# Patient Record
Sex: Male | Born: 1990 | Race: Black or African American | Hispanic: No | Marital: Single | Smoking: Current every day smoker
Health system: Southern US, Community
[De-identification: ages and names within clinical notes are randomized; demographics above are authoritative.]

---

## 2011-07-07 ENCOUNTER — Encounter (HOSPITAL_COMMUNITY): Payer: Self-pay

## 2011-07-07 ENCOUNTER — Emergency Department (HOSPITAL_COMMUNITY)
Admission: EM | Admit: 2011-07-07 | Discharge: 2011-07-07 | Disposition: A | Discharge status: Home / Self Care | Payer: Self-pay | Attending: Emergency Medicine | Admitting: Emergency Medicine

## 2011-07-07 ENCOUNTER — Emergency Department (HOSPITAL_COMMUNITY): Payer: Self-pay

## 2011-07-07 DIAGNOSIS — Z720 Tobacco use: Secondary | ICD-10-CM

## 2011-07-07 DIAGNOSIS — J410 Simple chronic bronchitis: Secondary | ICD-10-CM | POA: Insufficient documentation

## 2011-07-07 DIAGNOSIS — F17200 Nicotine dependence, unspecified, uncomplicated: Secondary | ICD-10-CM | POA: Insufficient documentation

## 2011-07-07 LAB — POCT I-STAT, CHEM 8
Calcium, Ion: 1.23 mmol/L (ref 1.12–1.32)
Chloride: 104 mEq/L (ref 96–112)
HCT: 46 % (ref 39.0–52.0)
Potassium: 4.1 mEq/L (ref 3.5–5.1)
Sodium: 140 mEq/L (ref 135–145)

## 2011-07-07 MED ORDER — IOHEXOL 350 MG/ML SOLN
80.0000 mL | Freq: Once | INTRAVENOUS | Status: AC | PRN
  Administered 2011-07-07: 80 mL via INTRAVENOUS

## 2011-07-07 MED ORDER — ALBUTEROL SULFATE HFA 108 (90 BASE) MCG/ACT IN AERS
2.0000 | INHALATION_SPRAY | Freq: Four times a day (QID) | RESPIRATORY_TRACT | Status: DC
  Administered 2011-07-07: 2 via RESPIRATORY_TRACT
  Filled 2011-07-07: qty 6.7

## 2011-07-07 NOTE — ED Notes (Signed)
Pt d/c home in NAD. Pt instructed on smoking cessation. Voiced understanding of d.c instructions.

## 2011-07-07 NOTE — ED Notes (Signed)
Patient is a cigarette smoker and reports he's been coughing up blood tinged sputum intermittently x 1 month.  Patient also reports he had 1 episode of vomiting and noted a small about of blood this past Friday.

## 2011-07-07 NOTE — ED Provider Notes (Signed)
History     CSN: [id number]  Arrival date & time 07/07/11  4098   First MD Initiated Contact with Patient 07/07/11 1017      Chief Complaint  Patient presents with  . Cough    coughing up blood x 1 month    (Consider location/radiation/quality/duration/timing/severity/associated sxs/prior treatment) HPI Comments: Patient reports he has been coughing with episodes of hemoptysis for the past month.  States his sputum has turned yellow.  Is unable to quantify the amount of blood - states it is not streaks of blood and it is not entirely blood.  Has had occasional wheezing at night. Pt smokes 1-2 packs/day and has been smoking for 9 years.  Denies fevers, night sweats, unintended weight loss or weight gain, shortness of breath, chest pain.    Patient is a 21 y.o. male presenting with cough. The history is provided by the patient.  Cough Associated symptoms include wheezing. Pertinent negatives include no chest pain, no chills and no shortness of breath.    History reviewed. No pertinent past medical history.  History reviewed. No pertinent past surgical history.  History reviewed. No pertinent family history.  History  Substance Use Topics  . Smoking status: Current Everyday Smoker  . Smokeless tobacco: Not on file  . Alcohol Use: Yes      Review of Systems  Constitutional: Negative for fever, chills, activity change, appetite change and unexpected weight change.  Respiratory: Positive for cough and wheezing. Negative for shortness of breath.   Cardiovascular: Negative for chest pain.  Gastrointestinal: Negative for nausea, vomiting, abdominal pain and diarrhea.  All other systems reviewed and are negative.    Allergies  Review of patient's allergies indicates no known allergies.  Home Medications  No current outpatient prescriptions on file.  BP 133/85  Pulse 90  Temp(Src) 98.2 F (36.8 C) (Oral)  Resp 18  SpO2 100%  Physical Exam  Nursing note and vitals  reviewed. Constitutional: He is oriented to person, place, and time. He appears well-developed and well-nourished. No distress.  HENT:  Head: Normocephalic and atraumatic.  Neck: Neck supple.  Cardiovascular: Normal rate, regular rhythm and normal heart sounds.   Pulmonary/Chest: Effort normal and breath sounds normal. No respiratory distress. He has no wheezes. He has no rales. He exhibits no tenderness.  Neurological: He is alert and oriented to person, place, and time.  Skin: He is not diaphoretic.  Psychiatric: He has a normal mood and affect. His behavior is normal. Judgment and thought content normal.    ED Course  Procedures (including critical care time)  Labs Reviewed - No data to display No results found.  Discussed patient with Dr Clarene Duke.  2:31 PM Discussed results with patient.  Strongly advised patient to stop smoking, discussed dangers of smoking and some methods to help him quit.    1. Bronchitis due to tobacco use      MDM  Patient is a heavy smoker with 1 month history of cough with some amount of hemoptysis.  CT angio is negative.  Likely airway irritation from smoking.  I discussed smoking cessation with patient and advised that he quit.  Patient verbalizes understanding and agrees with plan.        Rise Patience, [place] 07/07/11 1528

## 2011-07-07 NOTE — ED Notes (Signed)
Report given to Raynelle Fanning, RN in CDU

## 2011-07-07 NOTE — ED Notes (Signed)
Pt given instructions on how to use inhaler. Pt demonstrated use properly

## 2011-07-07 NOTE — Discharge Instructions (Signed)
Please read the information below.  Use the resources below to find a primary care provider to schedule a follow up appointment.  You may return to the ER at any time for worsening condition or any new symptoms that concern you.   Bronchitis Bronchitis is the body's way of reacting to injury and/or infection (inflammation) of the bronchi. Bronchi are the air tubes that extend from the windpipe into the lungs. If the inflammation becomes severe, it may cause shortness of breath. CAUSES  Inflammation may be caused by:  A virus.   Germs (bacteria).   Dust.   Allergens.   Pollutants and many other irritants.  The cells lining the bronchial tree are covered with tiny hairs (cilia). These constantly beat upward, away from the lungs, toward the mouth. This keeps the lungs free of pollutants. When these cells become too irritated and are unable to do their job, mucus begins to develop. This causes the characteristic cough of bronchitis. The cough clears the lungs when the cilia are unable to do their job. Without either of these protective mechanisms, the mucus would settle in the lungs. Then you would develop pneumonia. Smoking is a common cause of bronchitis and can contribute to pneumonia. Stopping this habit is the single most important thing you can do to help yourself. TREATMENT   Your caregiver may prescribe an antibiotic if the cough is caused by bacteria. Also, medicines that open up your airways make it easier to breathe. Your caregiver may also recommend or prescribe an expectorant. It will loosen the mucus to be coughed up. Only take over-the-counter or prescription medicines for pain, discomfort, or fever as directed by your caregiver.   Removing whatever causes the problem (smoking, for example) is critical to preventing the problem from getting worse.   Cough suppressants may be prescribed for relief of cough symptoms.   Inhaled medicines may be prescribed to help with symptoms now  and to help prevent problems from returning.   For those with recurrent (chronic) bronchitis, there may be a need for steroid medicines.  SEEK IMMEDIATE MEDICAL CARE IF:   During treatment, you develop more pus-like mucus (purulent sputum).   You have a fever.   Your baby is older than 3 months with a rectal temperature of 102 F (38.9 C) or higher.   Your baby is 45 months old or younger with a rectal temperature of 100.4 F (38 C) or higher.   You become progressively more ill.   You have increased difficulty breathing, wheezing, or shortness of breath.  It is necessary to seek immediate medical care if you are elderly or sick from any other disease. MAKE SURE YOU:   Understand these instructions.   Will watch your condition.   Will get help right away if you are not doing well or get worse.  Document Released: 04/06/2005 Document Revised: 03/26/2011 Document Reviewed: 02/14/2008 [facility] Patient Information 2012 [place], [place]  .Metered Dose Inhaler (No Spacer Used) Inhaled medicines are the basis of asthma treatment and other breathing problems. Inhaled medicine can only be effective if used properly. Good technique assures that the medicine reaches the lungs. Metered dose inhalers (MDIs) are used to deliver a variety of inhaled medicines. These include quick relief medicines, controller medicines (such as corticosteroids), and cromolyn. The medicine is delivered by pushing down on a metal canister to release a set amount of spray.  If you are using different kinds of inhalers, use your quick relief medicine to open the airways  10 to 15 minutes before using a steroid. If you are unsure which inhalers to use and the order of using them, ask your caregiver, nurse, or respiratory therapist. HOW TO USE THE INHALER 1. Remove cap from inhaler.  2. Shake inhaler for 5 seconds before each inhalation (breathing in).  3. Position the inhaler so that the top of the canister faces up.    4. Put your index finger on the top of the medication canister. Your thumb supports the bottom of the inhaler.  5. Open your mouth.  6. Hold the inhaler 1 to 2 inches away from your open mouth. This allows the medicine to slow down before the medicine enters the mouth.  7. Exhale (breathe out) normally and as completely as possible.  8. Press the canister down with the index finger to release the medication.  9. At the same time as the canister is pressed, inhale deeply and slowly until the lungs are completely filled. This should take 4 to 6 seconds. Keep your tongue down.  10. Hold the medication in your lungs for up to 10 seconds (10 seconds is best). This helps the medicine get into the small airways of your lungs to work better.  11. Breathe out slowly, through pursed lips. Whistling is an example of pursed lips.  12. Wait at least 1 minute between puffs. Continue with the above steps until you have taken the number of puffs your caregiver has ordered.  13. Replace cap on inhaler.  AVOID:  Inhaling before or after starting the spray of medicine. It takes practice to coordinate your breathing with triggering the spray.   Inhaling through the nose (rather than the mouth) when triggering the spray.  HOW TO DETERMINE IF YOUR INHALER IS FULL OR NEARLY EMPTY:  Determine when an inhaler is empty. You cannot know when an MDI canister is empty by shaking it. A few MDIs are now being made with dose counters. Ask your caregiver for a prescription that has a dose counter if you feel you need that extra help.   If your inhaler does not have a counter, check the number of doses in the inhaler before you use it. The canister or box will list the number of doses in the canister. Divide the total number of doses in the canister by the number you will use each day to find how many days the canister will last. (For example, if your canister has 200 doses and you take 2 puffs, 4 times each day, which is 8  puffs a day. Dividing 200 by 8 equals 25. The canister should last 25 days.) Using a calendar, count forward that many days to see when your inhaler will run out. Write the refill date on a calendar or your canister.   Remember, if you need to take extra doses, the inhaler will empty sooner than you figured. Be sure you have a refill before your canister runs out. Refill your inhaler 7 to 10 days before it runs out.  HOME CARE INSTRUCTIONS   Do not use the inhaler more than your caregiver tells you. If you are still wheezing and are feeling tightness in your chest, call your caregiver.   Keep an adequate supply of medication. This includes making sure the medicine is not expired, and you have a spare MDI.   Follow your caregiver or inhaler insert directions for cleaning the inhaler.  SEEK MEDICAL CARE IF:   Symptoms are only partially relieved with your inhalers.  You are having trouble using your inhalers.   You experience some increase in phlegm.   You develop a fever of 102 F (38.9 C).  SEEK IMMEDIATE MEDICAL CARE IF:   You feel little or no relief with your inhalers. You are still wheezing and are feeling shortness of breath and/or tightness in your chest.   You have side effects such as dizziness, headaches, or fast heart rate.   You have chills, fever, night sweats or an oral temperature above 102 F (38.9 C) develops.   Phlegm production increases a lot, or there is blood in the phlegm.  MAKE SURE YOU:   Understand these instructions.   Will watch your condition.   Will get help right away if you are not doing well or get worse.  Document Released: 02/01/2007 Document Revised: 03/26/2011 Document Reviewed: 01/22/2009 [facility] Patient Information 2012 [place], [place].   If you have no primary doctor, here are some resources that may be helpful:  Medicaid-accepting [facility] Providers:   - [facility]- [address]      [phone]      Mon-Fri 9am-7pm, Sat 9am-1pm   - [facility]- [address] [place]      [phone]   - [facility]- [address] [place]      [phone]   [facility]- [address]      [phone]   - Renaye Rakers- [address]      [phone]      Only accepts [place] patients       after they have her name applied to their card   Self Pay (no insurance) in [place]:   - Sickle Cell Patients: Dr Willey Blade, [facility]      [address] [place]      [phone]   - Health [phone]   - Physician Referral Service- [phone]   - [facility]- [address] [place]      [phone]   [facility] [place]- [address]   - [facility]- see information above      (Speak to [employer] if you do not have insurance)   - [facility]- [address] [place]      [phone]   - [facility] [place]- [address]      [phone]   - [facility]- [address]      [phone]   - Dr Julio Sicks-  [address], [place]      [phone]   - [facility]- [address]      [phone]   - [facility]- [address]      [phone]      Also [address]      [phone]   - [facility]- [address]      [phone]      1st and 3rd Saturday every month, 10am-1pm Other agencies that provide inexpensive medical care:    [facility]  [phone]    [facility]  [phone]    [facility]  [phone]    [facility]  [phone]    [facility]  [phone]  General Information: Finding a doctor  when you do not have health insurance can be tricky. Although you are not limited by an insurance plan, you are of course limited by her finances and how much but he can pay out of pocket.  What  are your options if you don't have health insurance?   1) Find a Librarian, academic and Pay Out of Pocket Although you won't have to find out who is covered by your insurance plan, it is a good idea to ask around and get recommendations. You will then need to call the office and see if the doctor you have chosen will accept you as a new patient and what types of options they offer for patients who are self-pay. Some doctors offer discounts or will set up payment plans for their patients who do not have insurance, but you will need to ask so you aren't surprised when you get to your appointment.  2) Contact Your Local Health Department Not all health departments have doctors that can see patients for sick visits, but many do, so it is worth a call to see if yours does. If you don't know where your local health department is, you can check in your phone book. The CDC also has a tool to help you locate your state's health department, and many state websites also have listings of all of their local health departments.  3) Find a Walk-in Clinic If your illness is not likely to be very severe or complicated, you may want to try a walk in clinic. These are popping up all over the country in pharmacies, drugstores, and shopping centers. They're usually staffed by nurse practitioners or physician assistants that have been trained to treat common illnesses and complaints. They're usually fairly quick and inexpensive. However, if you have serious medical issues or chronic medical problems, these are probably not your best option   RESOURCE GUIDE  Dental Problems  Patients with Medicaid: [facility] [phone] [address]                                           [id number] [address] [employer] Phone:  [phone]                                                  Phone:  [phone]  If unable to pay or uninsured, contact:  [facility] or [facility] to become qualified  for the adult dental clinic.  Chronic Pain Problems Contact [facility]  [phone] Patients need to be referred by their primary care doctor.  Insufficient Money for Medicine Contact [employer] Way:  call "211" or [facility] [phone].  No Primary Care Doctor Call [facility]  [phone] Other agencies that provide inexpensive medical care    [facility]  [phone]    [facility]  [phone]    [facility]  [phone]    [facility]  [phone]    [facility]  [phone]    [facility]  [phone]  [facility]ological [facility]  [phone] [facility]  [phone] [facility]  Mental Health   [phone] (emergency services [phone])  Substance Abuse Resources [facility]  [phone] [facility] [phone] [facility] [place] [phone] [facility] [phone] Residential & Outpatient Substance Abuse Program  6045141128  Abuse/Neglect [facility] Child Abuse Hotline 8135500170 [facility] Child Abuse Hotline 334-225-2297 (After Hours)  Emergency Shelter [facility] [phone]  Maternity Homes [facility] [place] [facility] [place] [phone] [facility] [phone]  MRSA Hotline #:   (970) 659-2388    [facility] Resources  [facility] [place]     [employer]                          [facility] [address] [place]                       [address]      [address]  Blondell Reveal Phone:  [phone]                                   Phone:  [phone]                 Phone:  [phone]  [facility] Phone:  [phone]  [facility] Child Abuse Hotline (862)739-7569 424-381-6405 (After Hours)  Metered Dose Inhaler (No  Spacer Used) Inhaled medicines are the basis of asthma treatment and other breathing problems. Inhaled medicine can only be effective if used properly. Good technique assures that the medicine reaches the lungs. Metered dose inhalers (MDIs) are used to deliver a variety of inhaled medicines. These include quick relief medicines, controller medicines (such as corticosteroids), and cromolyn. The medicine is delivered by pushing down on a metal canister to release a set amount of spray.  If you are using different kinds of inhalers, use your quick relief medicine to open the airways 10 to 15 minutes before using a steroid. If you are unsure which inhalers to use and the order of using them, ask your caregiver, nurse, or respiratory therapist. HOW TO USE THE INHALER 14. Remove cap from inhaler.  15. Shake inhaler for 5 seconds before each inhalation (breathing in).  16. Position the inhaler so that the top of the canister faces up.  17. Put your index finger on the top of the medication canister. Your thumb supports the bottom of the inhaler.  18. Open your mouth.  19. Hold the inhaler 1 to 2 inches away from your open mouth. This allows the medicine to slow down before the medicine enters the mouth.  20. Exhale (breathe out) normally and as completely as possible.  21. Press the canister down with the index finger to release the medication.  22. At the same time as the canister is pressed, inhale deeply and slowly until the lungs are completely filled. This should take 4 to 6  seconds. Keep your tongue down.  23. Hold the medication in your lungs for up to 10 seconds (10 seconds is best). This helps the medicine get into the small airways of your lungs to work better.  24. Breathe out slowly, through pursed lips. Whistling is an example of pursed lips.  25. Wait at least 1 minute between puffs. Continue with the above steps until you have taken the number of puffs your caregiver has ordered.   26. Replace cap on inhaler.  AVOID:  Inhaling before or after starting the spray of medicine. It takes practice to coordinate your breathing with triggering the spray.   Inhaling through the nose (rather than the mouth) when triggering the spray.  HOW TO DETERMINE IF YOUR INHALER IS FULL OR NEARLY EMPTY:  Determine when an inhaler is empty. You cannot know when an MDI canister is empty by shaking it. A few MDIs are now being made with dose counters. Ask your caregiver for a prescription that has a dose counter if you feel you need that extra help.   If your inhaler does not have a counter, check the number of doses in the inhaler before you use it. The canister or box will list the number of doses in the canister. Divide the total number of doses in the canister by the number you will use each day to find how many days the canister will last. (For example, if your canister has 200 doses and you take 2 puffs, 4 times each day, which is 8 puffs a day. Dividing 200 by 8 equals 25. The canister should last 25 days.) Using a calendar, count forward that many days to see when your inhaler will run out. Write the refill date on a calendar or your canister.   Remember, if you need to take extra doses, the inhaler will empty sooner than you figured. Be sure you have a refill before your canister runs out. Refill your inhaler 7 to 10 days before it runs out.  HOME CARE INSTRUCTIONS   Do not use the inhaler more than your caregiver tells you. If you are still wheezing and are feeling tightness in your chest, call your caregiver.   Keep an adequate supply of medication. This includes making sure the medicine is not expired, and you have a spare MDI.   Follow your caregiver or inhaler insert directions for cleaning the inhaler.  SEEK MEDICAL CARE IF:   Symptoms are only partially relieved with your inhalers.   You are having trouble using your inhalers.   You experience some increase in phlegm.    You develop a fever of 102 F (38.9 C).  SEEK IMMEDIATE MEDICAL CARE IF:   You feel little or no relief with your inhalers. You are still wheezing and are feeling shortness of breath and/or tightness in your chest.   You have side effects such as dizziness, headaches, or fast heart rate.   You have chills, fever, night sweats or an oral temperature above 102 F (38.9 C) develops.   Phlegm production increases a lot, or there is blood in the phlegm.  MAKE SURE YOU:   Understand these instructions.   Will watch your condition.   Will get help right away if you are not doing well or get worse.  Document Released: 02/01/2007 Document Revised: 03/26/2011 Document Reviewed: 01/22/2009 [facility] Patient Information 2012 [place], [place].

## 2011-07-07 NOTE — ED Notes (Signed)
Patient reporting intermittently coughing up blood tinged sputum x 1 month.  Patient is a current cigarette smoker; no cough present, patient resting quietly, reporting 3/10 throat pain as well.  Throat does not appear inflamed.

## 2011-07-09 NOTE — ED Provider Notes (Signed)
Medical screening examination/treatment/procedure(s) were performed by non-physician practitioner and as supervising physician I was immediately available for consultation/collaboration.   Nabor Thomann M Vear Staton, DO 07/09/11 1258 

## 2012-10-10 ENCOUNTER — Emergency Department (HOSPITAL_COMMUNITY)
Admission: EM | Admit: 2012-10-10 | Discharge: 2012-10-10 | Disposition: A | Discharge status: Home / Self Care | Payer: Self-pay | Source: Home / Self Care

## 2013-03-17 IMAGING — CT CT ANGIO CHEST
2 of 6 series · 19 of 46 positions shown · IV contrast (APPLIED)
Comparison: Chest radiograph of 07/07/2011.

CLINICAL DATA: Intermittent blood tinged sputum.  Tobacco use.
Throat pain.

CT ANGIOGRAPHY CHEST
TECHNIQUE: Multidetector CT imaging of the chest using the
standard protocol during bolus administration of intravenous
contrast. Multiplanar reconstructed images including MIPs were
obtained and reviewed to evaluate the vascular anatomy.
Contrast: 80mL OMNIPAQUE IOHEXOL 350 MG/ML IV SOLN

[Series 6: pulm embolism 1.0 b25f thin · axial · 0.65mm/px · z∈[-258,+20]mm · 16 of 306 slices shown]
[im 14/306  lung]
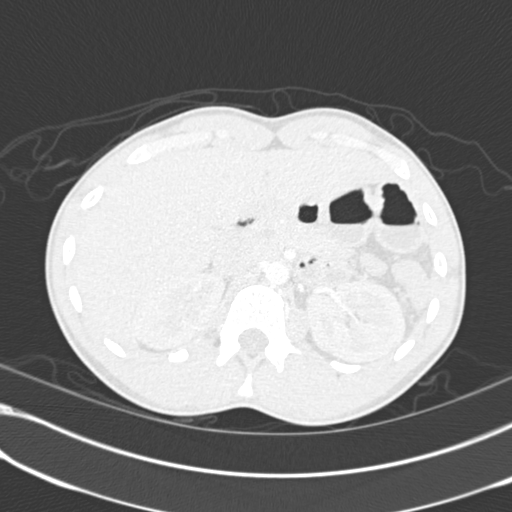
[im 40/306  soft-tissue]
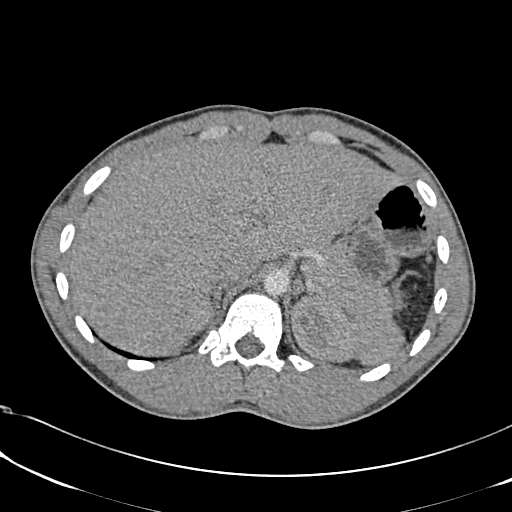
[im 54/306  lung]
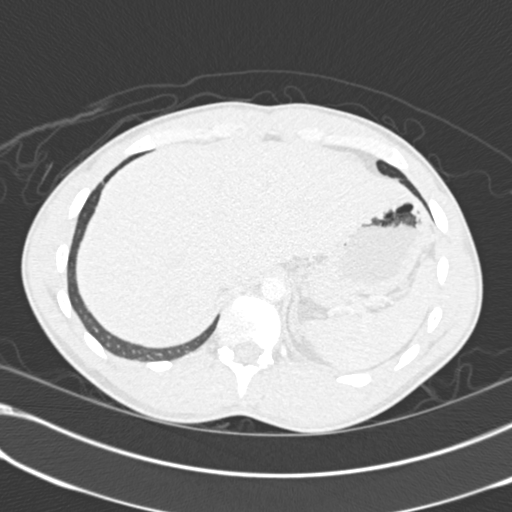
[im 67/306  soft-tissue]
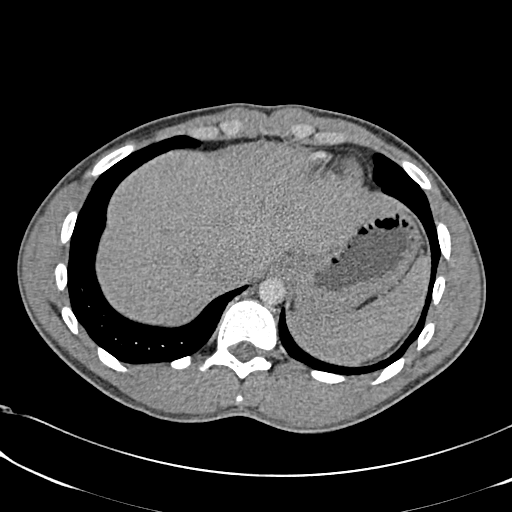
[im 93/306  lung]
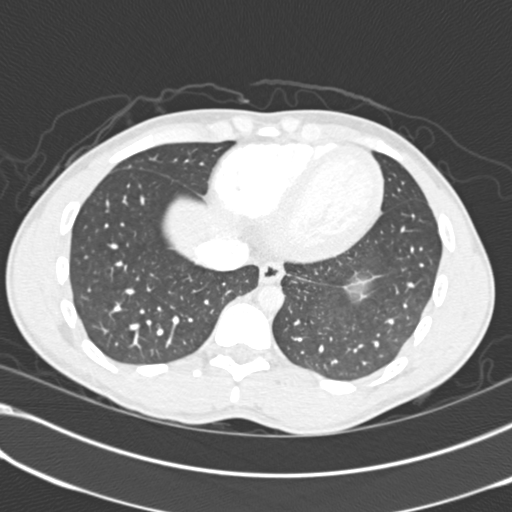
[im 107/306  soft-tissue]
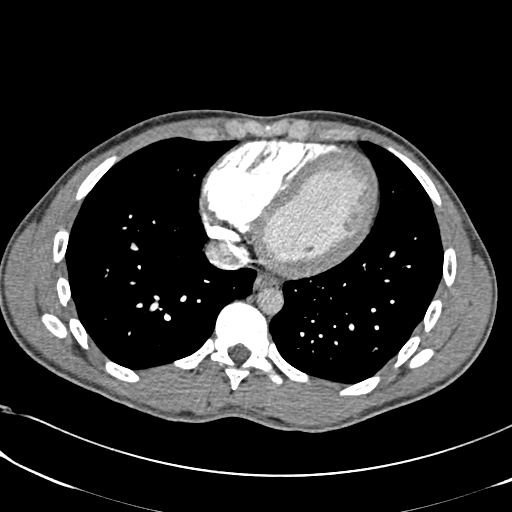
[im 120/306  lung]
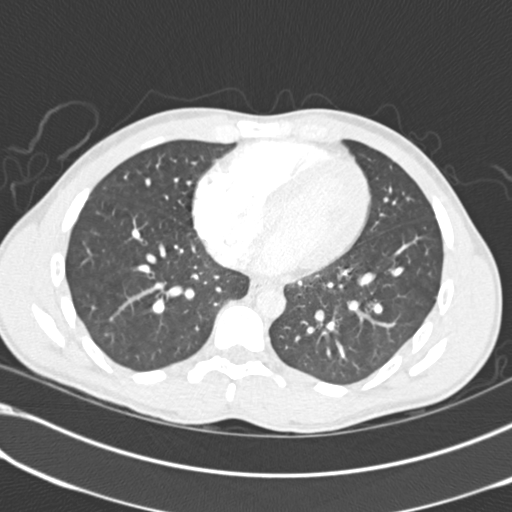
[im 146/306  soft-tissue]
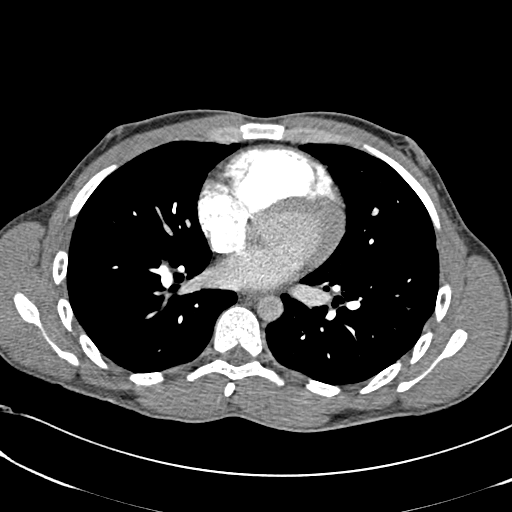
[im 160/306  lung]
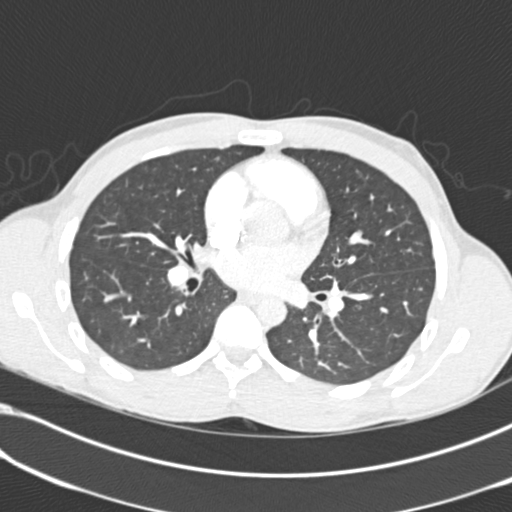
[im 186/306  soft-tissue]
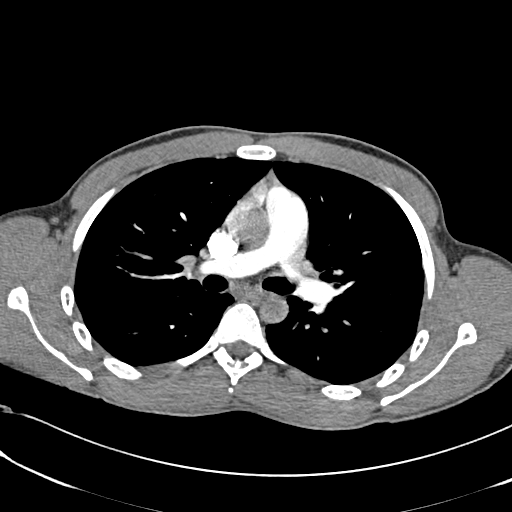
[im 199/306  lung]
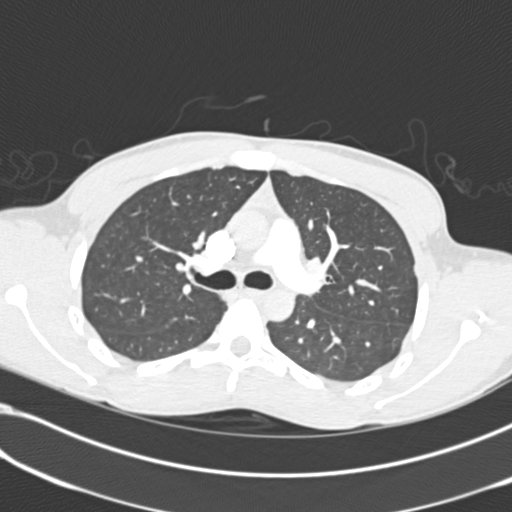
[im 213/306  soft-tissue]
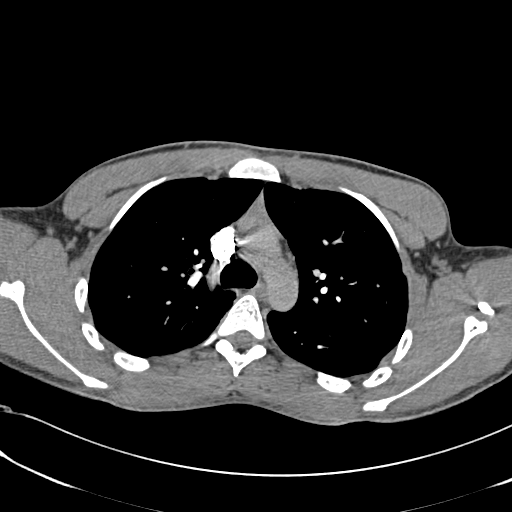
[im 239/306  lung]
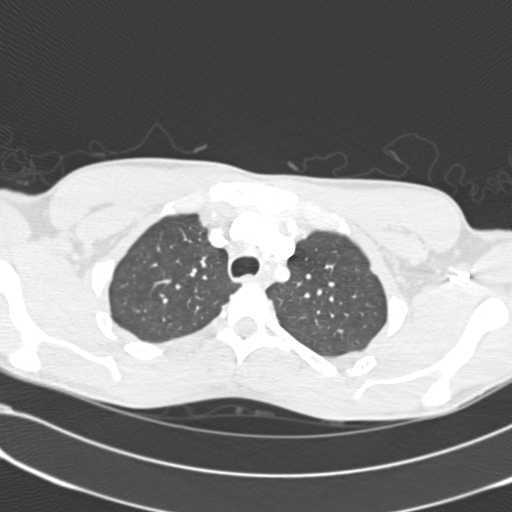
[im 252/306  soft-tissue]
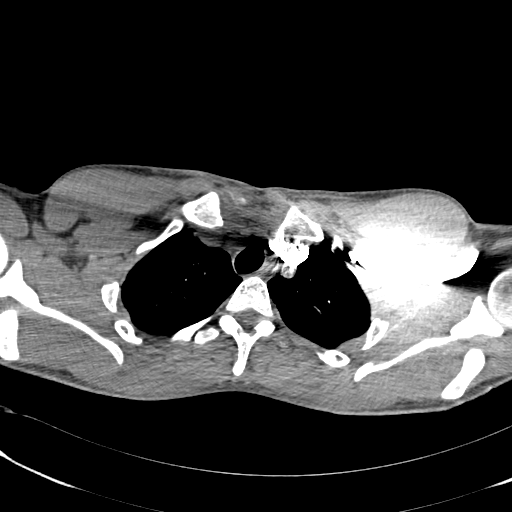
[im 266/306  lung]
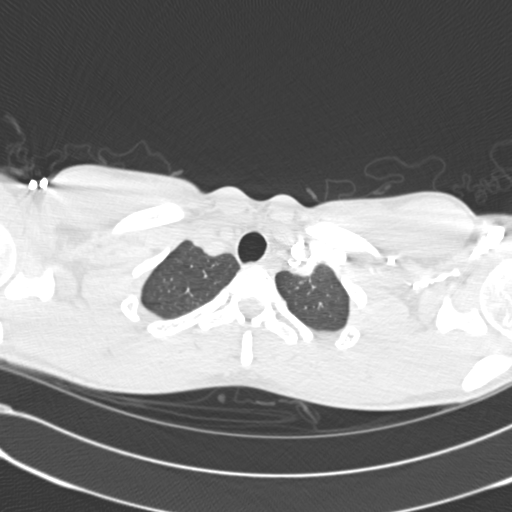
[im 292/306  soft-tissue]
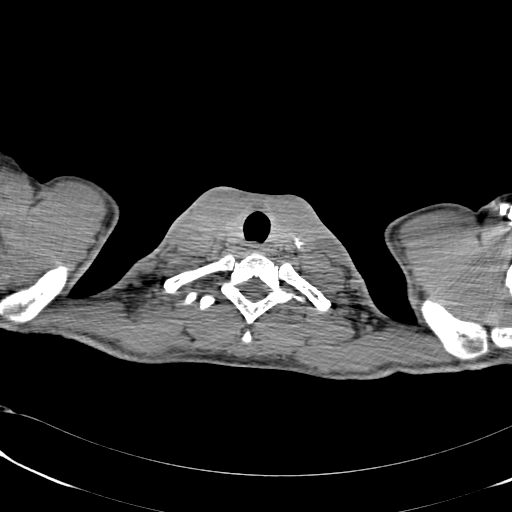

[Series 602: cor · coronal · 0.65mm/px · 3 of 86 slices shown]
[im 22/86  soft-tissue]
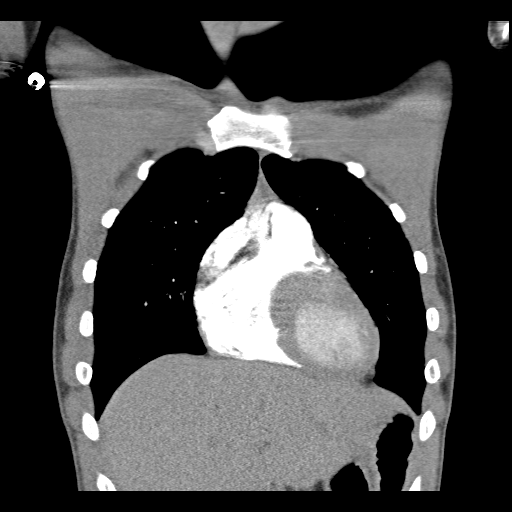
[im 43/86  soft-tissue]
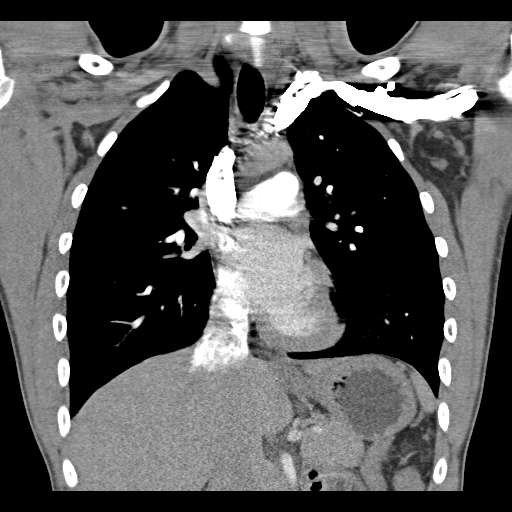
[im 64/86  soft-tissue]
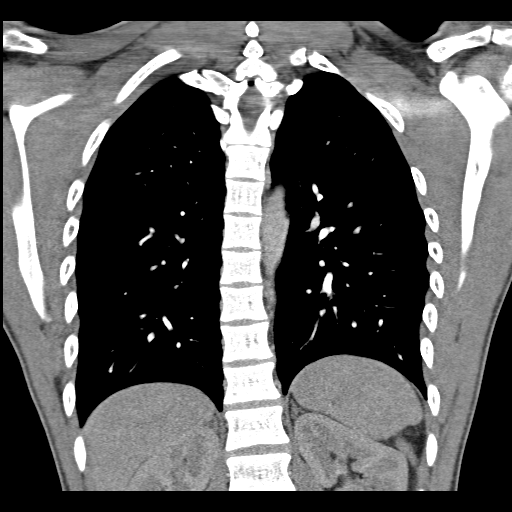

[19 of 46 positions shown; findings below may reference images not displayed]

FINDINGS: No filling defect is identified in the pulmonary arterial
tree to suggest pulmonary embolus.

We do not have adequate contrast in the thoracic aorta to assess
for dissection, although no obvious signs of dissection or
observed.  Mild contrast reflux into the IVC is likely incidental,
and there is no reversible of the contour of the interventricular
septum.

Minimal residual anterior mediastinal thymic tissue noted.
Borderline right hilar adenopathy is present.

Posterior filling defect in the trachea is compatible with mucus.
There is borderline airway thickening.  The lungs appear otherwise
clear.
IMPRESSION: 1.  No embolus identified.
2.  Borderline airway thickening, conceivably representing minimal
bronchitis or reactive airways disease.
3.  Dependent mucous in the trachea.

## 2013-03-17 IMAGING — CR DG CHEST 2V
2 series · 2 of 2 positions shown · non-contrast
Comparison: None.

CLINICAL DATA: Cough.  Hemoptysis.  Tobacco use.

CHEST - 2 VIEW

[w chest pa]
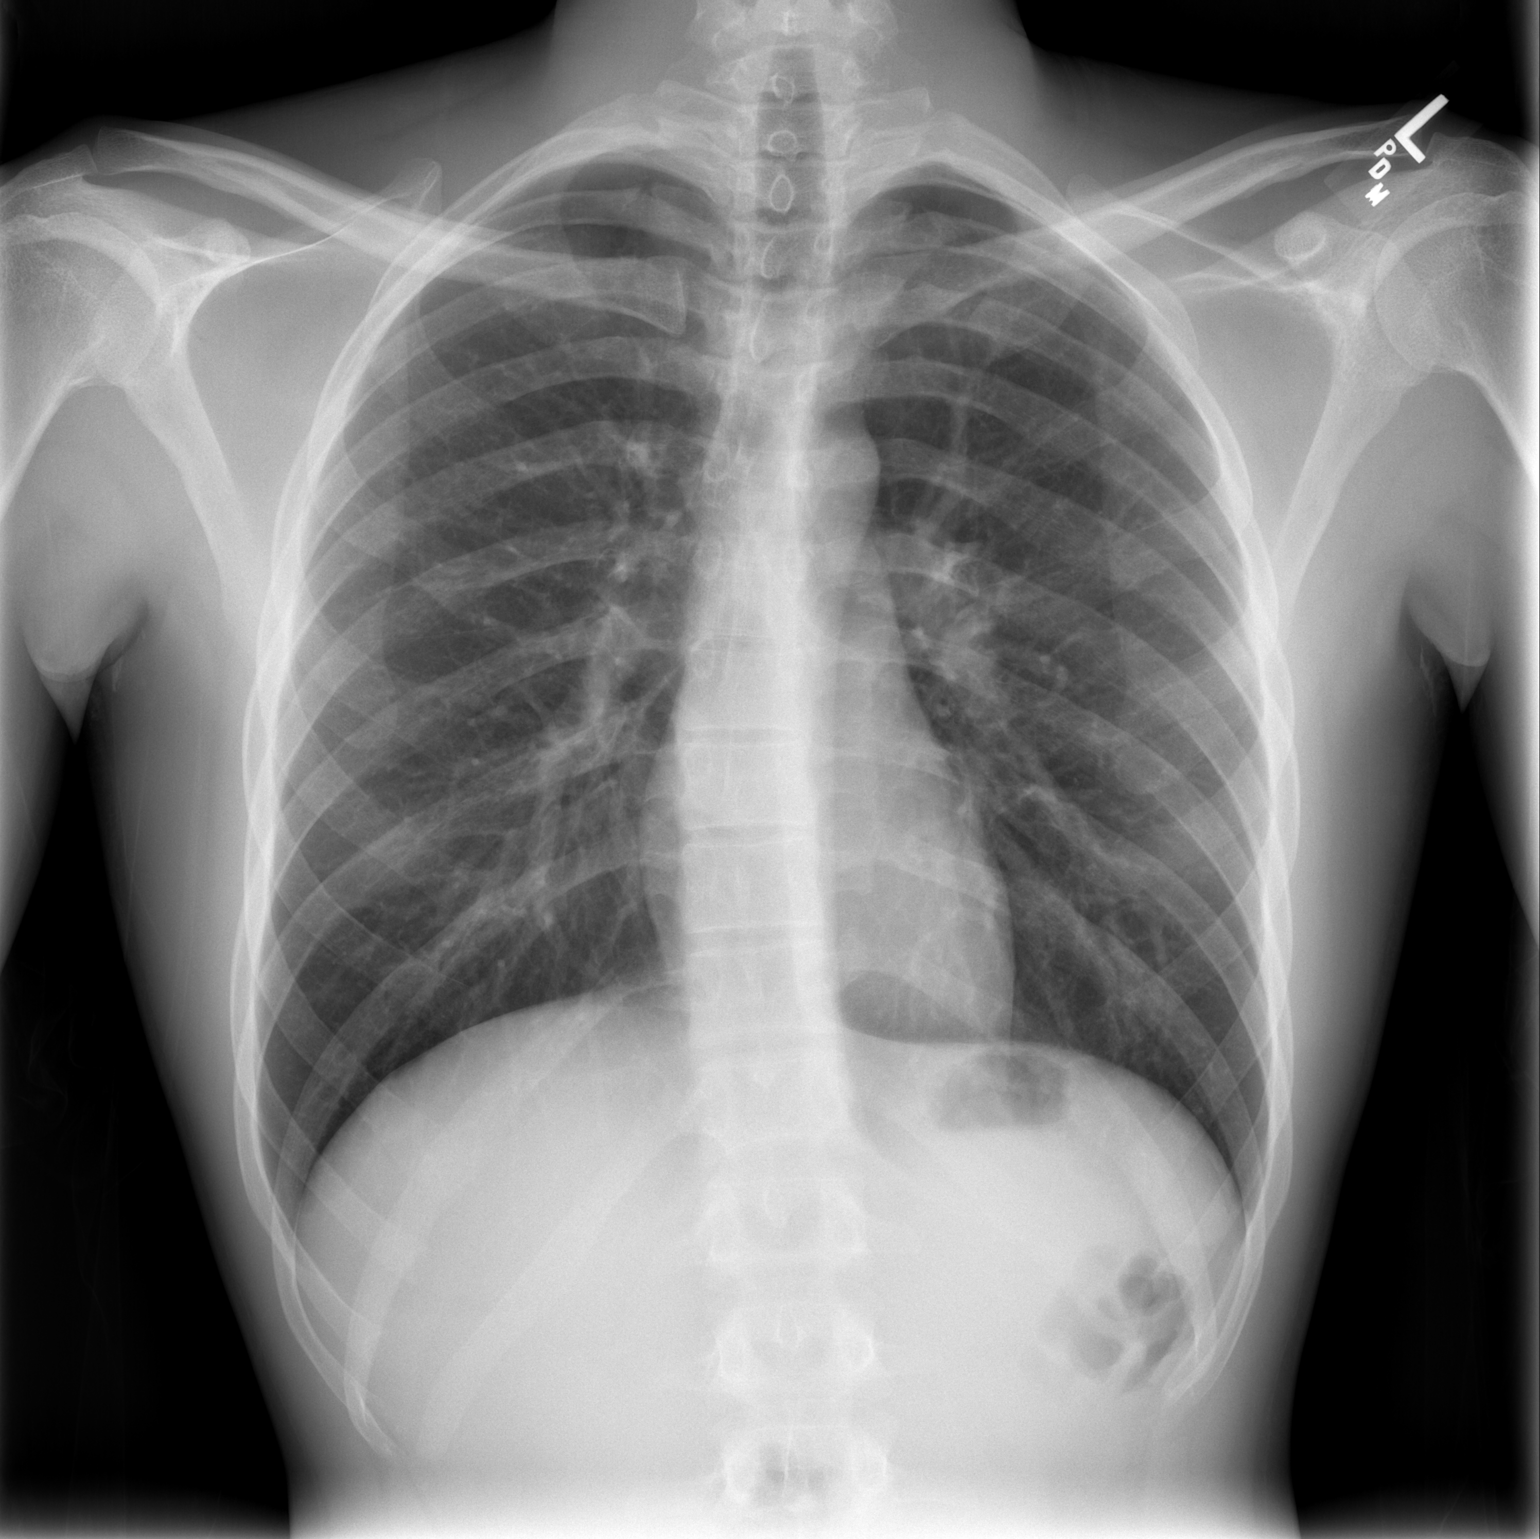

[w chest lat]
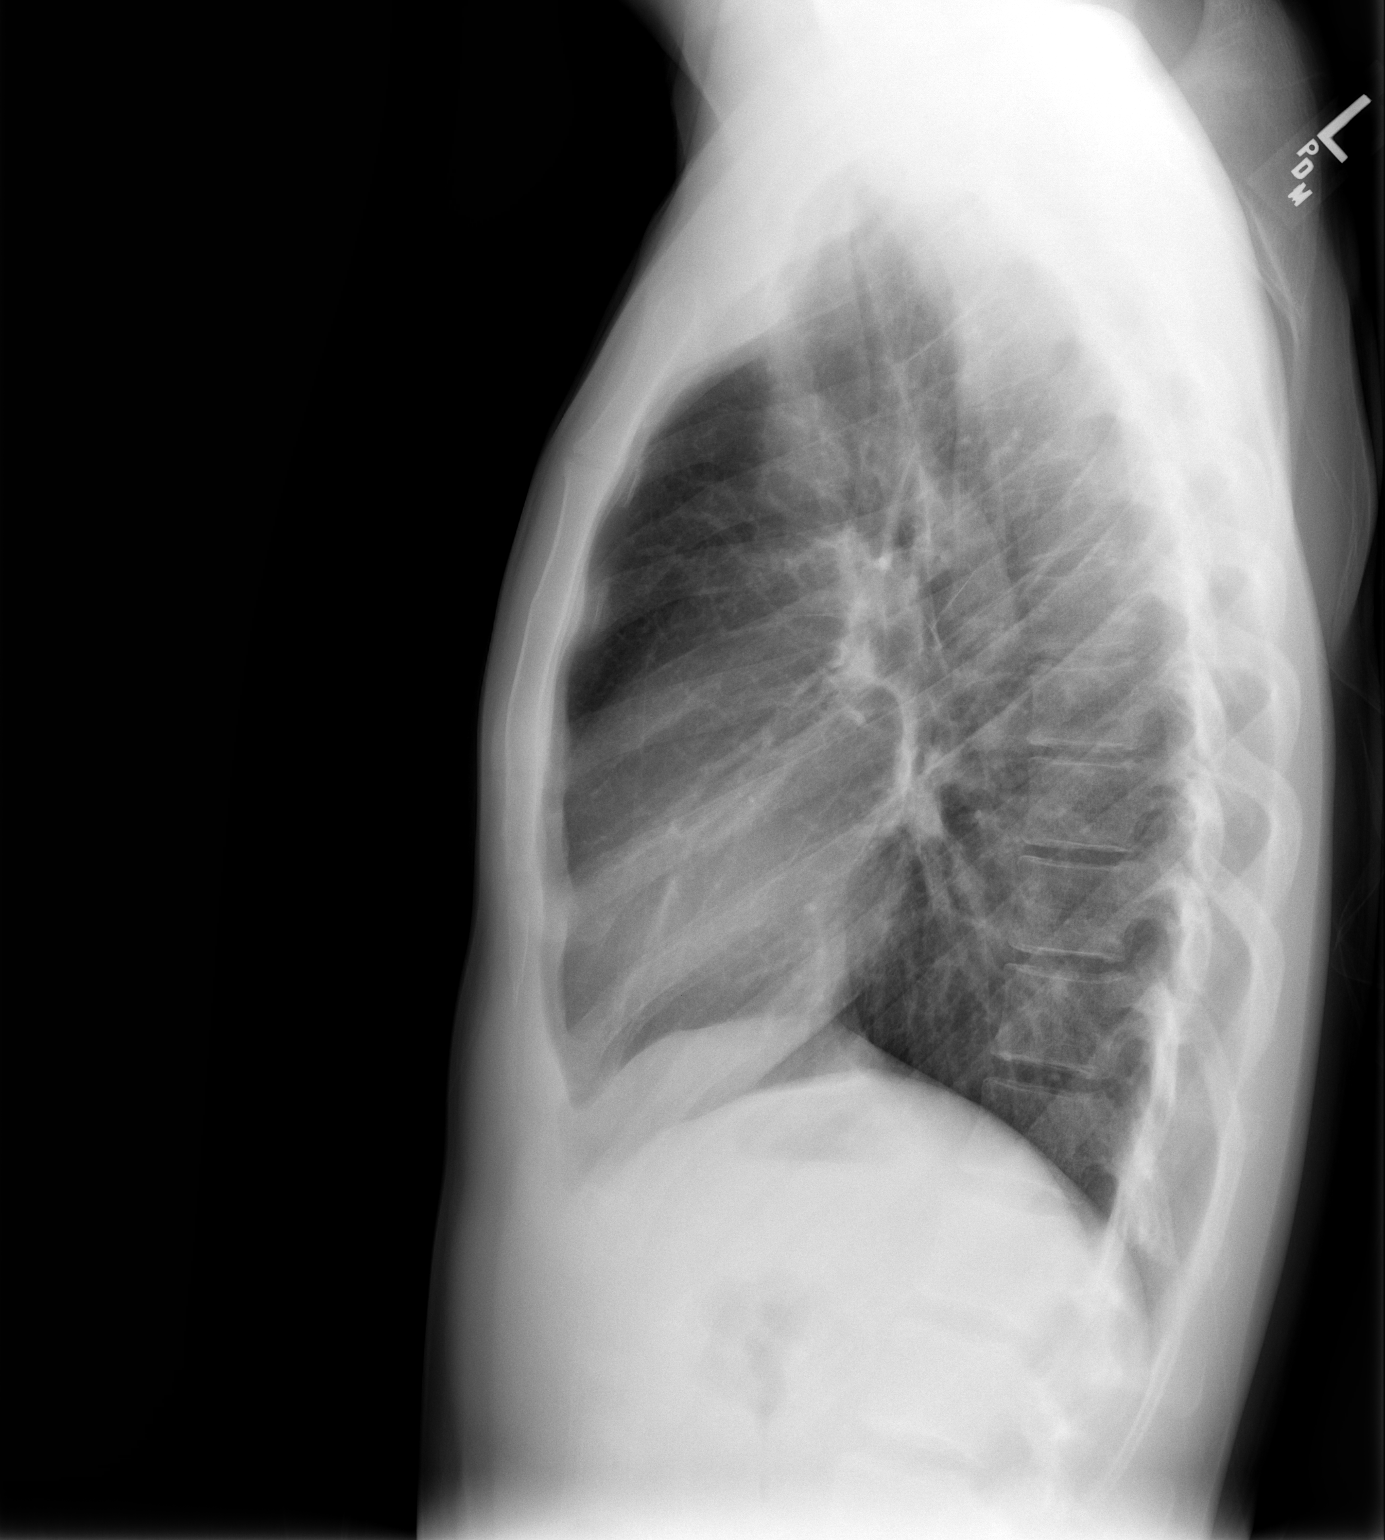

[2 of 2 positions shown; findings below may reference images not displayed]

FINDINGS: Cardiac and mediastinal contours appear normal.

The lungs appear clear.

No pleural effusion is identified.
IMPRESSION: 1.  No significant abnormality identified.  If the patient has true
hemoptysis, CT chest may be warranted.

## 2021-01-24 ENCOUNTER — Ambulatory Visit
Admission: EM | Admit: 2021-01-24 | Discharge: 2021-01-24 | Disposition: A | Discharge status: Home / Self Care | Payer: Self-pay | Attending: Emergency Medicine | Admitting: Emergency Medicine

## 2021-01-24 ENCOUNTER — Other Ambulatory Visit: Payer: Self-pay

## 2021-01-24 ENCOUNTER — Encounter: Payer: Self-pay | Admitting: Emergency Medicine

## 2021-01-24 DIAGNOSIS — K029 Dental caries, unspecified: Secondary | ICD-10-CM

## 2021-01-24 MED ORDER — IBUPROFEN 800 MG PO TABS: 800.0000 mg | ORAL_TABLET | Freq: Three times a day (TID) | ORAL | 0 refills | Status: AC | PRN

## 2021-01-24 MED ORDER — AMOXICILLIN 875 MG PO TABS: 875.0000 mg | ORAL_TABLET | Freq: Two times a day (BID) | ORAL | 0 refills | Status: AC

## 2021-01-24 NOTE — Discharge Instructions (Addendum)
Take the antibiotic as prescribed.    A dental resource guide is attached.  Please call to make an appointment with a dentist as soon as possible.    Go to the emergency department if you have acute worsening symptoms.     

## 2021-01-24 NOTE — ED Triage Notes (Signed)
Pt c/o left side dental pain x 2 days

## 2021-01-24 NOTE — ED Provider Notes (Signed)
Jose Watkins    CSN: [id number] Arrival date & time: 01/24/21  1535      History   Chief Complaint Chief Complaint  Patient presents with   Dental Pain    HPI Jose Watkins is a 30 y.o. male.  Patient presents with 2 to 3-day history of left lower tooth ache.  He denies fever, chills, difficulty swallowing, sore throat, shortness of breath, or other symptoms.  Treatment at home with Tylenol.  He denies pertinent medical history.  The history is provided by the patient.   History reviewed. No pertinent past medical history.  There are no problems to display for this patient.   History reviewed. No pertinent surgical history.     Home Medications    Prior to Admission medications   Medication Sig Start Date End Date Taking? Authorizing Provider  amoxicillin (AMOXIL) 875 MG tablet Take 1 tablet (875 mg total) by mouth 2 (two) times daily for 7 days. 01/24/21 01/31/21 Yes Mickie Bail, NP  ibuprofen (ADVIL) 800 MG tablet Take 1 tablet (800 mg total) by mouth every 8 (eight) hours as needed. 01/24/21  Yes Mickie Bail, NP    Family History History reviewed. No pertinent family history.  Social History Social History   Tobacco Use   Smoking status: Every Day   Smokeless tobacco: Never  Vaping Use   Vaping Use: Never used  Substance Use Topics   Alcohol use: Yes   Drug use: No     Allergies   Patient has no known allergies.   Review of Systems Review of Systems  Constitutional:  Negative for chills and fever.  HENT:  Positive for dental problem. Negative for sore throat and trouble swallowing.   Respiratory:  Negative for cough and shortness of breath.   Cardiovascular:  Negative for chest pain and palpitations.  Skin:  Negative for color change and rash.  All other systems reviewed and are negative.   Physical Exam Triage Vital Signs ED Triage Vitals  Enc Vitals Group     BP      Pulse      Resp      Temp      Temp src      SpO2       Weight      Height      Head Circumference      Peak Flow      Pain Score      Pain Loc      Pain Edu?      Excl. in GC?    No data found.  Updated Vital Signs BP (!) 157/101 (BP Location: Left Arm)   Pulse (!) 115   Temp 98.2 F (36.8 C)   Resp (!) 22   SpO2 98%   Visual Acuity Right Eye Distance:   Left Eye Distance:   Bilateral Distance:    Right Eye Near:   Left Eye Near:    Bilateral Near:     Physical Exam Vitals and nursing note reviewed.  Constitutional:      General: He is not in acute distress.    Appearance: He is well-developed.  HENT:     Head: Normocephalic and atraumatic.     Mouth/Throat:     Mouth: Mucous membranes are moist.     Dentition: Dental tenderness and dental caries present.     Pharynx: Oropharynx is clear.      Comments: Speech clear.  No oropharyngeal swelling.  No difficulty  swallowing. Eyes:     Conjunctiva/sclera: Conjunctivae normal.  Cardiovascular:     Rate and Rhythm: Normal rate and regular rhythm.     Heart sounds: No murmur heard. Pulmonary:     Effort: Pulmonary effort is normal. No respiratory distress.     Breath sounds: Normal breath sounds.  Abdominal:     Palpations: Abdomen is soft.     Tenderness: There is no abdominal tenderness.  Musculoskeletal:     Cervical back: Neck supple.  Skin:    General: Skin is warm and dry.  Neurological:     General: No focal deficit present.     Mental Status: He is alert.     Gait: Gait normal.     UC Treatments / Results  Labs (all labs ordered are listed, but only abnormal results are displayed) Labs Reviewed - No data to display  EKG   Radiology No results found.  Procedures Procedures (including critical care time)  Medications Ordered in UC Medications - No data to display  Initial Impression / Assessment and Plan / UC Course  I have reviewed the triage vital signs and the nursing notes.  Pertinent labs & imaging results that were available  during my care of the patient were reviewed by me and considered in my medical decision making (see chart for details).  Tooth ache due to dental caries.  Treating with amoxicillin and ibuprofen.  Instructed patient to schedule an appointment with his dentist as soon as possible; dental resource guide provided.  Instructed him to go to the ED if he has acute worsening symptoms.  Patient agrees to plan of care.   Final Clinical Impressions(s) / UC Diagnoses   Final diagnoses:  Pain due to dental caries     Discharge Instructions      Take the antibiotic as prescribed.    A dental resource guide is attached.  Please call to make an appointment with a dentist as soon as possible.    Go to the emergency department if you have acute worsening symptoms.         ED Prescriptions     Medication Sig Dispense Auth. Provider   amoxicillin (AMOXIL) 875 MG tablet Take 1 tablet (875 mg total) by mouth 2 (two) times daily for 7 days. 14 tablet Wendee Beavers H, NP   ibuprofen (ADVIL) 800 MG tablet Take 1 tablet (800 mg total) by mouth every 8 (eight) hours as needed. 21 tablet Mickie Bail, NP      I have reviewed the PDMP during this encounter.   Mickie Bail, NP 01/24/21 1601
